# Patient Record
Sex: Female | Born: 1990 | Hispanic: Yes | Marital: Married | Smoking: Never smoker
Health system: Southern US, Community
[De-identification: ages and names within clinical notes are randomized; demographics above are authoritative.]

## PROBLEM LIST (undated history)

## (undated) DIAGNOSIS — Z789 Other specified health status: Secondary | ICD-10-CM

## (undated) HISTORY — PX: NO PAST SURGERIES: SHX2092

---

## 2016-08-19 LAB — OB RESULTS CONSOLE VARICELLA ZOSTER ANTIBODY, IGG: VARICELLA IGG: IMMUNE

## 2016-08-19 LAB — OB RESULTS CONSOLE ANTIBODY SCREEN: ANTIBODY SCREEN: NEGATIVE

## 2016-08-19 LAB — OB RESULTS CONSOLE ABO/RH: RH Type: POSITIVE

## 2016-08-19 LAB — OB RESULTS CONSOLE PLATELET COUNT: PLATELETS: 318

## 2016-08-19 LAB — OB RESULTS CONSOLE RUBELLA ANTIBODY, IGM: RUBELLA: IMMUNE

## 2016-08-19 LAB — OB RESULTS CONSOLE GC/CHLAMYDIA
CHLAMYDIA, DNA PROBE: NEGATIVE
GC PROBE AMP, GENITAL: NEGATIVE

## 2016-08-19 LAB — OB RESULTS CONSOLE HEPATITIS B SURFACE ANTIGEN: Hepatitis B Surface Ag: NEGATIVE

## 2016-08-19 LAB — OB RESULTS CONSOLE RPR: RPR: NONREACTIVE

## 2016-08-19 LAB — OB RESULTS CONSOLE HIV ANTIBODY (ROUTINE TESTING): HIV: NONREACTIVE

## 2016-08-19 LAB — OB RESULTS CONSOLE HGB/HCT, BLOOD
HEMATOCRIT: 37
HEMOGLOBIN: 12.3

## 2016-08-20 ENCOUNTER — Other Ambulatory Visit (HOSPITAL_COMMUNITY): Payer: Self-pay | Admitting: Nurse Practitioner

## 2016-08-20 ENCOUNTER — Encounter (HOSPITAL_COMMUNITY): Payer: Self-pay

## 2016-08-20 ENCOUNTER — Ambulatory Visit (HOSPITAL_COMMUNITY): Admission: RE | Admit: 2016-08-20 | Payer: Self-pay | Source: Ambulatory Visit

## 2016-08-20 ENCOUNTER — Ambulatory Visit (HOSPITAL_COMMUNITY)
Admission: RE | Admit: 2016-08-20 | Discharge: 2016-08-20 | Disposition: A | Discharge status: Home / Self Care | Payer: Self-pay | Source: Ambulatory Visit | Attending: Nurse Practitioner | Admitting: Nurse Practitioner

## 2016-08-20 DIAGNOSIS — Z369 Encounter for antenatal screening, unspecified: Secondary | ICD-10-CM

## 2016-08-20 DIAGNOSIS — O09219 Supervision of pregnancy with history of pre-term labor, unspecified trimester: Secondary | ICD-10-CM

## 2016-08-20 DIAGNOSIS — O99211 Obesity complicating pregnancy, first trimester: Secondary | ICD-10-CM

## 2016-08-20 DIAGNOSIS — Z3A13 13 weeks gestation of pregnancy: Secondary | ICD-10-CM

## 2016-08-20 DIAGNOSIS — Z3682 Encounter for antenatal screening for nuchal translucency: Secondary | ICD-10-CM | POA: Insufficient documentation

## 2016-08-20 HISTORY — DX: Other specified health status: Z78.9

## 2016-09-02 ENCOUNTER — Ambulatory Visit (INDEPENDENT_AMBULATORY_CARE_PROVIDER_SITE_OTHER): Payer: Self-pay | Admitting: Family Medicine

## 2016-09-02 ENCOUNTER — Encounter: Payer: Self-pay | Admitting: Family Medicine

## 2016-09-02 DIAGNOSIS — Z8751 Personal history of pre-term labor: Secondary | ICD-10-CM | POA: Insufficient documentation

## 2016-09-02 DIAGNOSIS — O0990 Supervision of high risk pregnancy, unspecified, unspecified trimester: Secondary | ICD-10-CM | POA: Insufficient documentation

## 2016-09-02 DIAGNOSIS — O09212 Supervision of pregnancy with history of pre-term labor, second trimester: Secondary | ICD-10-CM

## 2016-09-02 DIAGNOSIS — O0992 Supervision of high risk pregnancy, unspecified, second trimester: Secondary | ICD-10-CM

## 2016-09-02 NOTE — Progress Notes (Signed)
  Subjective:  Karina Ross is a [name] [redacted]w[redacted]d being seen today for her first obstetrical visit.  Her obstetrical history is significant for preterm delivery. [place] She was dilated to 4cm and the doctor induced her labor. No spontaneous ROM or PTL. Did have significant back, hip, and leg pain.. Patient does intend to breast feed. Pregnancy history fully reviewed.  Patient reports no complaints.  BP (!) 132/55   Pulse 84   Wt 175 lb (79.4 kg)   LMP 05/18/2016 (Exact Date)   BMI 32.01 kg/m   HISTORY: OB History  Gravida Para Term Preterm AB Living  2 1 0 1   1  SAB TAB Ectopic Multiple Live Births          1    # Outcome Date GA Lbr Len/2nd Weight Sex Delivery Anes PTL Lv  2 Current           1 Preterm 11/13/06 [redacted]w[redacted]d    Vag-Spont   LIV      Past Medical History:  Diagnosis Date  . Medical history non-contributory     Past Surgical History:  Procedure Laterality Date  . NO PAST SURGERIES      No family history on file.   Exam    System:     Skin: normal coloration and turgor, no rashes    Neurologic: gait normal; reflexes normal and symmetric   Extremities: normal strength, tone, and muscle mass   HEENT PERRLA and extra ocular movement intact   Mouth/Teeth mucous membranes moist, pharynx normal without lesions   Cardiovascular: regular rate and rhythm, no murmurs or gallops   Respiratory:  appears well, vitals normal, no respiratory distress, acyanotic, normal RR, ear and throat exam is normal, neck free of mass or lymphadenopathy, chest clear, no wheezing, crepitations, rhonchi, normal symmetric air entry   Abdomen: soft, non-tender; bowel sounds normal; no masses,  no organomegaly      Assessment:    Pregnancy: [name] Patient Active Problem List   Diagnosis Date Noted  . History of preterm delivery 09/02/2016  . Supervision of high risk pregnancy, antepartum 09/02/2016      Plan:   1. History of preterm  delivery It appears that this was an induced delivery. No high risk indications. Can return to HD for care. Will send note. Pt would like [place] scheduled, which we will do. - [place] MFM OB DETAIL +14 WK; Future  2. Supervision of high risk pregnancy, antepartum - [place] MFM OB DETAIL +14 WK; Future     Problem list reviewed and updated. 75% of 30 min visit spent on counseling and coordination of care.     Levie Heritage 09/02/2016

## 2016-09-24 ENCOUNTER — Encounter (HOSPITAL_COMMUNITY): Payer: Self-pay

## 2016-09-24 ENCOUNTER — Other Ambulatory Visit: Payer: Self-pay | Admitting: Family Medicine

## 2016-09-24 ENCOUNTER — Encounter: Payer: Self-pay | Admitting: *Deleted

## 2016-09-24 ENCOUNTER — Other Ambulatory Visit (HOSPITAL_COMMUNITY): Payer: Self-pay | Admitting: *Deleted

## 2016-09-24 ENCOUNTER — Ambulatory Visit (HOSPITAL_COMMUNITY)
Admission: RE | Admit: 2016-09-24 | Discharge: 2016-09-24 | Disposition: A | Discharge status: Home / Self Care | Payer: Self-pay | Source: Ambulatory Visit | Attending: Family Medicine | Admitting: Family Medicine

## 2016-09-24 DIAGNOSIS — O0992 Supervision of high risk pregnancy, unspecified, second trimester: Secondary | ICD-10-CM | POA: Insufficient documentation

## 2016-09-24 DIAGNOSIS — O99212 Obesity complicating pregnancy, second trimester: Secondary | ICD-10-CM | POA: Insufficient documentation

## 2016-09-24 DIAGNOSIS — Z363 Encounter for antenatal screening for malformations: Secondary | ICD-10-CM

## 2016-09-24 DIAGNOSIS — Z3A18 18 weeks gestation of pregnancy: Secondary | ICD-10-CM | POA: Insufficient documentation

## 2016-09-24 DIAGNOSIS — Z8751 Personal history of pre-term labor: Secondary | ICD-10-CM

## 2016-09-24 DIAGNOSIS — IMO0002 Reserved for concepts with insufficient information to code with codable children: Secondary | ICD-10-CM

## 2016-09-24 DIAGNOSIS — Z6832 Body mass index (BMI) 32.0-32.9, adult: Secondary | ICD-10-CM | POA: Insufficient documentation

## 2016-09-24 DIAGNOSIS — O09219 Supervision of pregnancy with history of pre-term labor, unspecified trimester: Secondary | ICD-10-CM | POA: Insufficient documentation

## 2016-09-24 DIAGNOSIS — O0990 Supervision of high risk pregnancy, unspecified, unspecified trimester: Secondary | ICD-10-CM

## 2016-09-24 DIAGNOSIS — Z0489 Encounter for examination and observation for other specified reasons: Secondary | ICD-10-CM

## 2016-09-24 IMAGING — US US MFM OB DETAIL+14 WK
1 series · 13 of 28 positions shown · non-contrast
Comparison: none

[Series 1: us mfm ob detail+14 wk · 83 acquisitions, 13 frames shown]
[im 4/83]
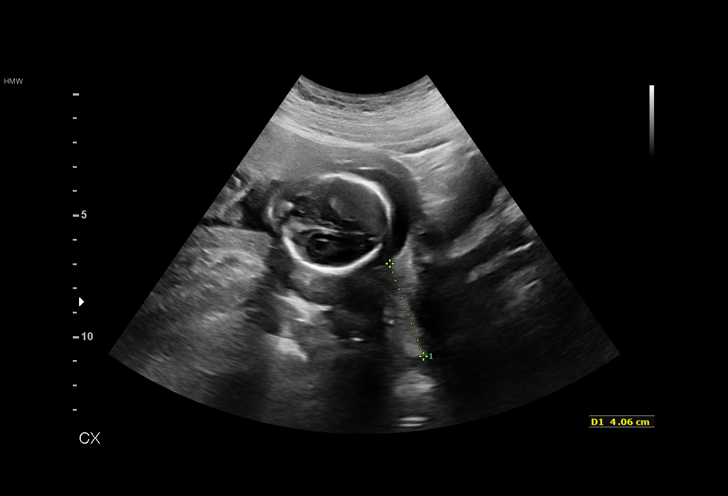
[im 10/83]
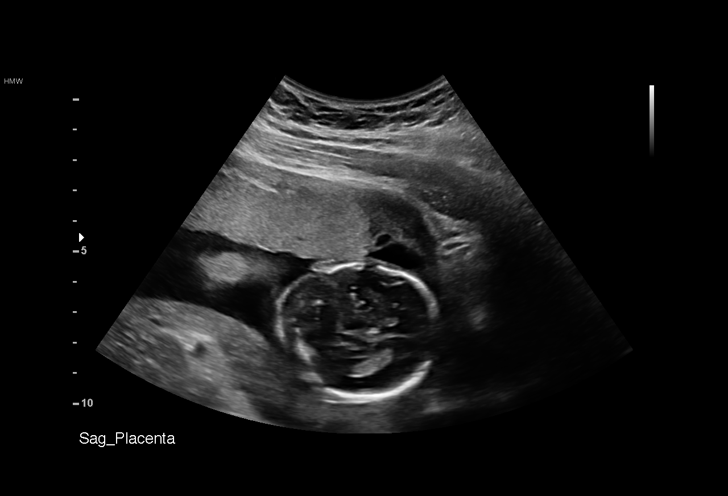
[im 16/83]
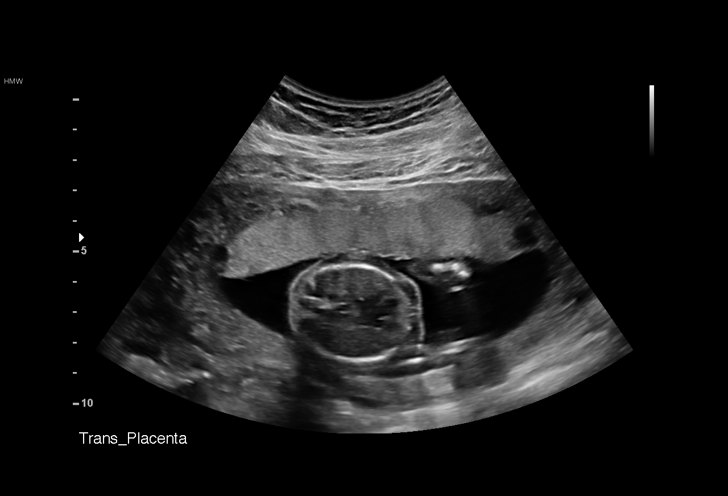
[im 22/83]
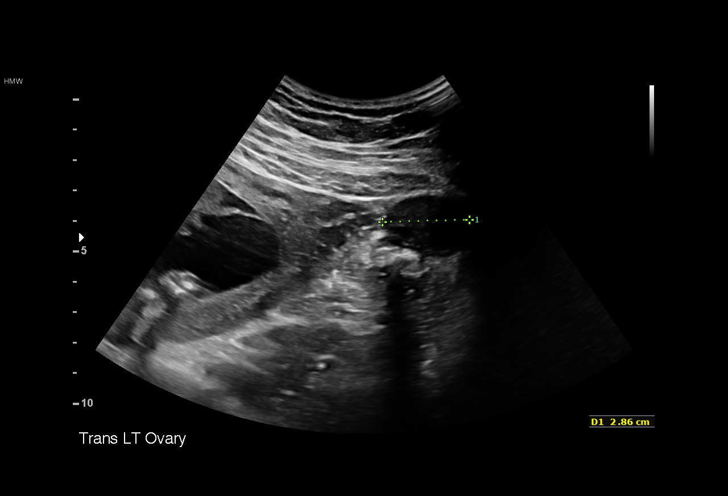
[im 28/83]
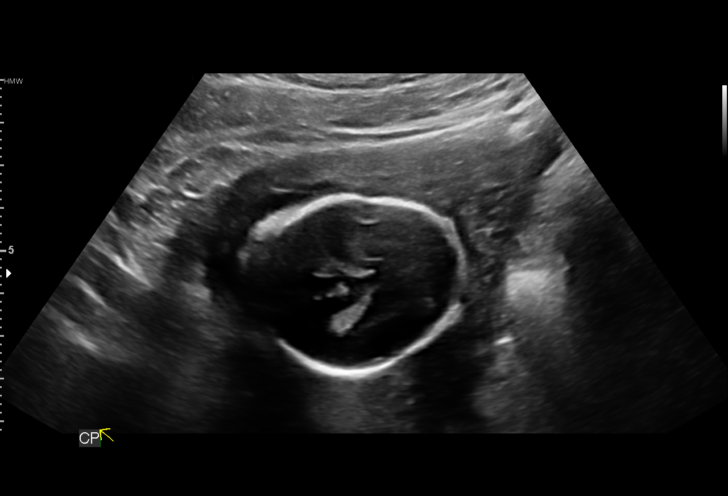
[im 34/83]
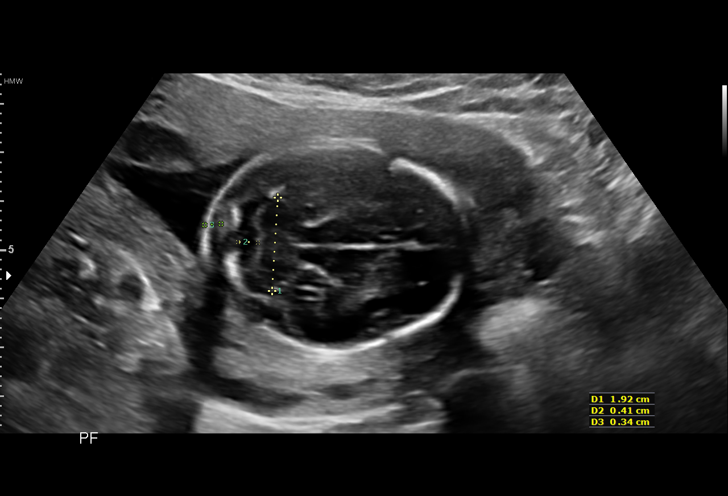
[im 43/83]
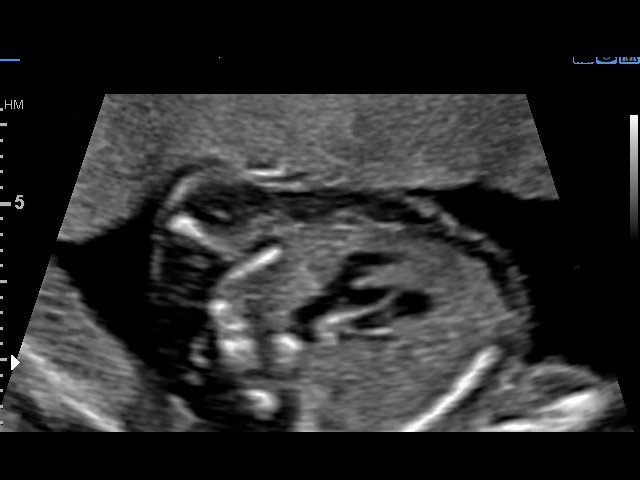
[im 49/83]
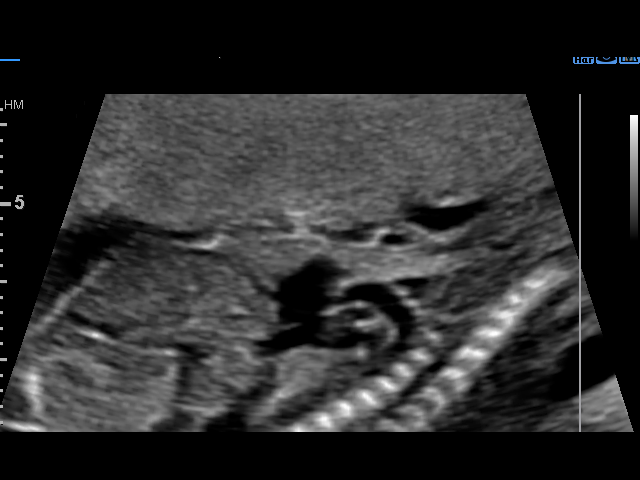
[im 55/83]
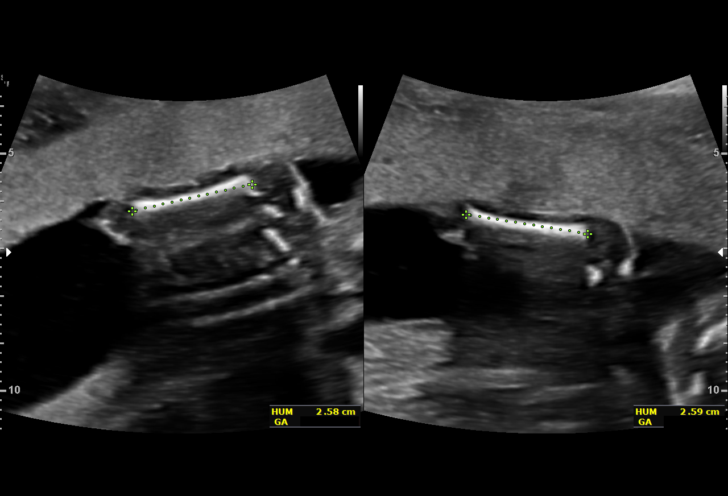
[im 61/83]
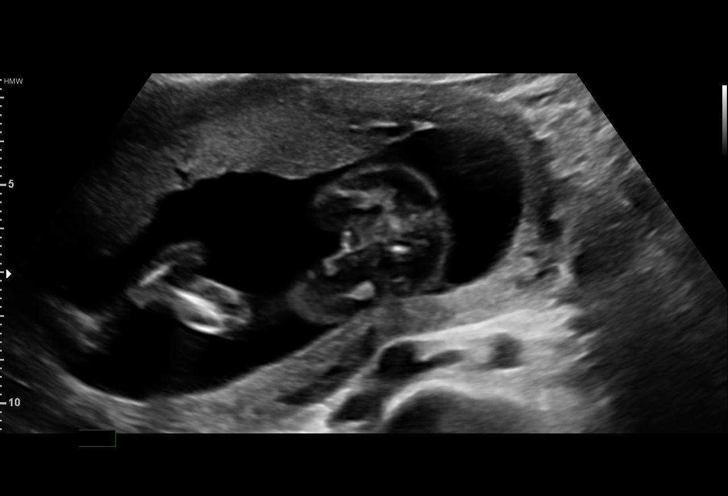
[im 67/83]
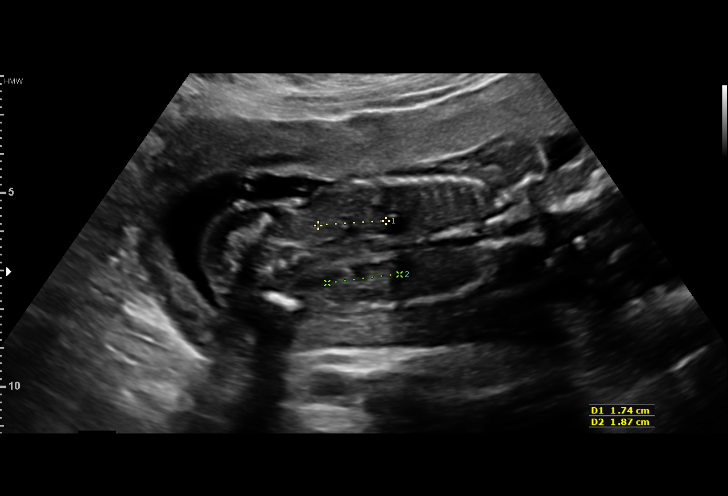
[im 73/83]
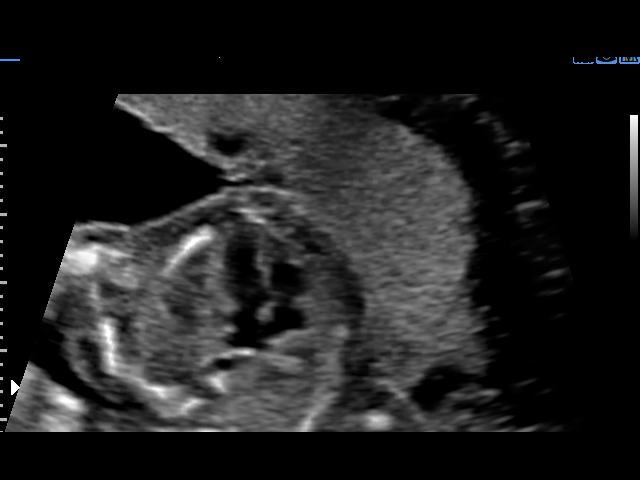
[im 79/83]
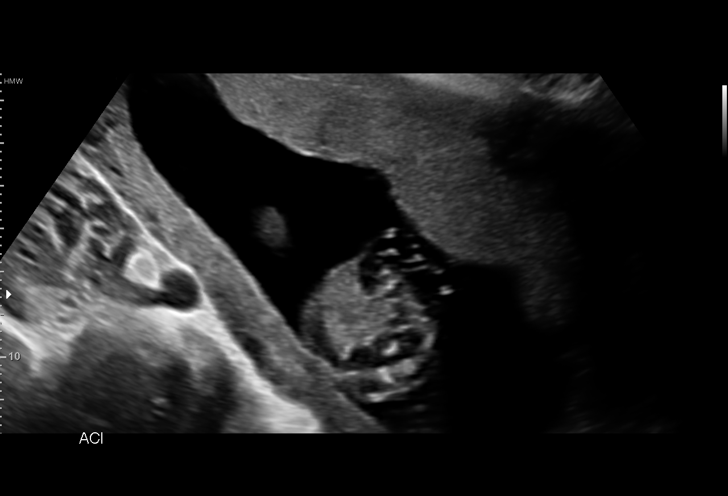

[13 of 28 positions shown; findings below may reference images not displayed]

pm)

JEYNIEL

JEYNIEL NP

Indications

18 weeks gestation of pregnancy
Obesity complicating pregnancy, first          [5G]
trimester (pregravid BMI 30)
Poor obstetric history: Previous preterm       [5G]
delivery, antepartum
Encounter for antenatal screening for          [5G]
malformations
OB History

Blood Type:            Height:  5'2"   Weight (lb):  176      BMI:
Gravidity:    2         Term:   1        Prem:   0        SAB:   0
TOP:          0       Ectopic:  0        Living: 1
Fetal Evaluation

Num Of Fetuses:     1
Fetal Heart         124
Rate(bpm):
Cardiac Activity:   Observed
Presentation:       Cephalic
Placenta:           Anterior, above cervical os
P. Cord Insertion:  Visualized, central

Amniotic Fluid
AFI FV:      Subjectively within normal limits

Largest Pocket(cm)
3.8
Biometry

BPD:      41.3  mm     G. Age:  18w 3d         55  %    CI:        73.55   %   70 - 86
FL/HC:      18.8   %   15.8 - 18
HC:       153   mm     G. Age:  18w 2d         36  %    HC/AC:      1.11       1.07 -
AC:      137.3  mm     G. Age:  19w 1d         71  %    FL/BPD:     69.5   %
FL:       28.7  mm     G. Age:  18w 6d         58  %    FL/AC:      20.9   %   20 - 24
HUM:        26  mm     G. Age:  18w 1d         47  %

Est. FW:     264  gm      0 lb 9 oz     55  %
Gestational Age

LMP:           18w 3d       Date:   [DATE]                 EDD:   [DATE]
U/S Today:     18w 5d                                        EDD:   [DATE]
Best:          18w 3d    Det. By:   LMP  ([DATE])          EDD:   [DATE]
Anatomy

Cranium:               Appears normal         Aortic Arch:            Appears normal
Cavum:                 Appears normal         Ductal Arch:            Appears normal
Ventricles:            Appears normal         Diaphragm:              Appears normal
Choroid Plexus:        Appears normal         Stomach:                Appears normal, left
sided
Cerebellum:            Appears normal         Abdomen:                Appears normal
Posterior Fossa:       Appears normal         Abdominal Wall:         Appears nml (cord
insert, abd wall)
Nuchal Fold:           Appears normal         Cord Vessels:           Appears normal (3
vessel cord)
Face:                  Appears normal         Kidneys:                Appear normal
(orbits and profile)
Lips:                  Appears normal         Bladder:                Appears normal
Thoracic:              Appears normal         Spine:                  Not well visualized
Heart:                 Appears normal         Upper Extremities:      Appears normal
(4CH, axis, and situs
RVOT:                  Appears normal         Lower Extremities:      Appears normal
LVOT:                  Appears normal

Other:  Fetus appears to be a female. Heels visualized. Rt digit visualized.
Technically difficult due to maternal habitus and fetal position.
Cervix Uterus Adnexa

Cervix
Length:            3.4  cm.
Normal appearance by transvaginal scan

Uterus
No abnormality visualized.

Left Ovary
No adnexal mass visualized.

Right Ovary
No adnexal mass visualized.

Cul De Sac:   No free fluid seen.

Adnexa:       No abnormality visualized.
Impression

Single living intrauterine pregnancy at [5G].
Anterior placenta without evidence of previa.
Appropriate fetal growth.
Normal amniotic fluid volume.
The fetal spine is not well visualized.
The fetal anatomic survey is otherwise complete.
Normal fetal anatomy.
No fetal anomalies or soft markers of aneuploidy seen.
The cervix measures 3.4cm transvaginally without funneling
or debris.
Recommendations

Recommend follow-up ultrasound examination in 4-6 weeks
for completion of fetal anatomic survey.
Given the patient's history of induced labor at 35 weeks I do
not recommend serial ultrasounds for cervical length.

## 2016-10-22 ENCOUNTER — Ambulatory Visit (HOSPITAL_COMMUNITY): Admission: RE | Admit: 2016-10-22 | Payer: Self-pay | Source: Ambulatory Visit

## 2017-01-14 NOTE — L&D Delivery Note (Signed)
Delivery Note At 12:31 PM a viable female was delivered via Vaginal, Spontaneous (Presentation: vertex ;LOA  ).  APGAR: 9, 9; weight  Pending    Placenta status:delivered  ,intact .  Cord:loose nochal, 3V  with the following complications: none.  Cord pH: NA  Anesthesia:  epidural Episiotomy: None Lacerations:  Superficial vaginal  Suture Repair: 3.0 Est. Blood Loss (mL):  100  Mom to postpartum.  Baby to Couplet care / Skin to Skin.  Karina Ross 02/12/2017, 12:52 PM

## 2017-01-30 LAB — OB RESULTS CONSOLE GBS: GBS: NEGATIVE

## 2017-02-09 ENCOUNTER — Encounter (HOSPITAL_COMMUNITY): Payer: Self-pay | Admitting: Emergency Medicine

## 2017-02-09 ENCOUNTER — Inpatient Hospital Stay (HOSPITAL_COMMUNITY)
Admission: AD | Admit: 2017-02-09 | Discharge: 2017-02-10 | Disposition: A | Discharge status: Home / Self Care | Payer: Self-pay | Source: Ambulatory Visit | Attending: Family Medicine | Admitting: Family Medicine

## 2017-02-09 DIAGNOSIS — O479 False labor, unspecified: Secondary | ICD-10-CM

## 2017-02-09 DIAGNOSIS — O0990 Supervision of high risk pregnancy, unspecified, unspecified trimester: Secondary | ICD-10-CM

## 2017-02-09 DIAGNOSIS — O471 False labor at or after 37 completed weeks of gestation: Secondary | ICD-10-CM | POA: Insufficient documentation

## 2017-02-09 DIAGNOSIS — Z8751 Personal history of pre-term labor: Secondary | ICD-10-CM

## 2017-02-09 DIAGNOSIS — Z3A38 38 weeks gestation of pregnancy: Secondary | ICD-10-CM | POA: Insufficient documentation

## 2017-02-09 NOTE — MAU Note (Signed)
Pt here with c/o contraction since about 2130, 5 minutes apart. Denies bleeding or leaking. Reports good fetal movement.

## 2017-02-10 NOTE — MAU Note (Signed)
I have communicated with Dr. Doroteo GlassmanPhelps  and reviewed vital signs:  Vitals:   02/09/17 2340 02/10/17 0026  BP: 133/75 135/74  Pulse: 71 73  Resp: 18 17  Temp: 98.4 F (36.9 C) 98.2 F (36.8 C)  SpO2: 100%     Vaginal exam:  Dilation: 1 Effacement (%): 50 Cervical Position: Posterior Presentation: Vertex Exam by:: h.Fradel Baldonado, rn ,   Also reviewed contraction pattern and that non-stress test is reactive.  It has been documented that patient is contracting irregularly every 2-4  Minutes with no cervical change since seen in the office.  Patient denies any other complaints.  Based on this report provider has given order for discharge.  A discharge order and diagnosis entered by a provider.   Labor discharge instructions reviewed with patient.

## 2017-02-12 ENCOUNTER — Encounter (HOSPITAL_COMMUNITY): Payer: Self-pay | Admitting: *Deleted

## 2017-02-12 ENCOUNTER — Other Ambulatory Visit: Payer: Self-pay

## 2017-02-12 ENCOUNTER — Inpatient Hospital Stay (HOSPITAL_COMMUNITY)
Admission: AD | Admit: 2017-02-12 | Discharge: 2017-02-14 | DRG: 807 | Disposition: A | Discharge status: Home / Self Care | Payer: Medicaid Other | Source: Ambulatory Visit | Attending: Family Medicine | Admitting: Family Medicine

## 2017-02-12 ENCOUNTER — Inpatient Hospital Stay (HOSPITAL_COMMUNITY): Payer: Medicaid Other | Admitting: Anesthesiology

## 2017-02-12 DIAGNOSIS — O0990 Supervision of high risk pregnancy, unspecified, unspecified trimester: Secondary | ICD-10-CM

## 2017-02-12 DIAGNOSIS — Z3A38 38 weeks gestation of pregnancy: Secondary | ICD-10-CM

## 2017-02-12 DIAGNOSIS — Z8751 Personal history of pre-term labor: Secondary | ICD-10-CM

## 2017-02-12 DIAGNOSIS — Z3483 Encounter for supervision of other normal pregnancy, third trimester: Secondary | ICD-10-CM | POA: Diagnosis present

## 2017-02-12 LAB — CBC
HEMATOCRIT: 35.5 % — AB (ref 36.0–46.0)
Hemoglobin: 12.4 g/dL (ref 12.0–15.0)
MCH: 28.7 pg (ref 26.0–34.0)
MCHC: 34.9 g/dL (ref 30.0–36.0)
MCV: 82.2 fL (ref 78.0–100.0)
Platelets: 279 10*3/uL (ref 150–400)
RBC: 4.32 MIL/uL (ref 3.87–5.11)
RDW: 13.8 % (ref 11.5–15.5)
WBC: 11.5 10*3/uL — AB (ref 4.0–10.5)

## 2017-02-12 LAB — TYPE AND SCREEN
ABO/RH(D): O POS
Antibody Screen: NEGATIVE

## 2017-02-12 LAB — ABO/RH: ABO/RH(D): O POS

## 2017-02-12 LAB — RPR: RPR: NONREACTIVE

## 2017-02-12 MED ORDER — FLEET ENEMA 7-19 GM/118ML RE ENEM: 1.0000 | ENEMA | RECTAL | Status: DC | PRN

## 2017-02-12 MED ORDER — ACETAMINOPHEN 325 MG PO TABS: 650.0000 mg | ORAL_TABLET | ORAL | Status: DC | PRN

## 2017-02-12 MED ORDER — PRENATAL MULTIVITAMIN CH
1.0000 | ORAL_TABLET | Freq: Every day | ORAL | Status: DC
  Administered 2017-02-13 – 2017-02-14 (×2): 1 via ORAL
  Filled 2017-02-12 (×2): qty 1

## 2017-02-12 MED ORDER — EPHEDRINE 5 MG/ML INJ
10.0000 mg | INTRAVENOUS | Status: DC | PRN
  Filled 2017-02-12: qty 2

## 2017-02-12 MED ORDER — ONDANSETRON HCL 4 MG/2ML IJ SOLN: 4.0000 mg | INTRAMUSCULAR | Status: DC | PRN

## 2017-02-12 MED ORDER — LACTATED RINGERS IV SOLN
500.0000 mL | Freq: Once | INTRAVENOUS | Status: AC
  Administered 2017-02-12: 500 mL via INTRAVENOUS

## 2017-02-12 MED ORDER — DIPHENHYDRAMINE HCL 25 MG PO CAPS: 25.0000 mg | ORAL_CAPSULE | Freq: Four times a day (QID) | ORAL | Status: DC | PRN

## 2017-02-12 MED ORDER — OXYTOCIN 40 UNITS IN LACTATED RINGERS INFUSION - SIMPLE MED
1.0000 m[IU]/min | INTRAVENOUS | Status: DC
  Administered 2017-02-12: 2 m[IU]/min via INTRAVENOUS

## 2017-02-12 MED ORDER — FENTANYL 2.5 MCG/ML BUPIVACAINE 1/10 % EPIDURAL INFUSION (WH - ANES)
14.0000 mL/h | INTRAMUSCULAR | Status: DC | PRN
  Administered 2017-02-12: 14 mL/h via EPIDURAL
  Filled 2017-02-12: qty 100

## 2017-02-12 MED ORDER — COCONUT OIL OIL: 1.0000 "application " | TOPICAL_OIL | Status: DC | PRN

## 2017-02-12 MED ORDER — ONDANSETRON HCL 4 MG PO TABS: 4.0000 mg | ORAL_TABLET | ORAL | Status: DC | PRN

## 2017-02-12 MED ORDER — OXYTOCIN BOLUS FROM INFUSION
500.0000 mL | Freq: Once | INTRAVENOUS | Status: AC
  Administered 2017-02-12: 500 mL via INTRAVENOUS

## 2017-02-12 MED ORDER — IBUPROFEN 600 MG PO TABS
600.0000 mg | ORAL_TABLET | Freq: Four times a day (QID) | ORAL | Status: DC
  Administered 2017-02-12 – 2017-02-14 (×8): 600 mg via ORAL
  Filled 2017-02-12 (×8): qty 1

## 2017-02-12 MED ORDER — OXYTOCIN 40 UNITS IN LACTATED RINGERS INFUSION - SIMPLE MED
2.5000 [IU]/h | INTRAVENOUS | Status: DC
  Filled 2017-02-12: qty 1000

## 2017-02-12 MED ORDER — WITCH HAZEL-GLYCERIN EX PADS: 1.0000 "application " | MEDICATED_PAD | CUTANEOUS | Status: DC | PRN

## 2017-02-12 MED ORDER — LIDOCAINE HCL (PF) 1 % IJ SOLN
INTRAMUSCULAR | Status: DC | PRN
  Administered 2017-02-12: 6 mL via EPIDURAL
  Administered 2017-02-12: 7 mL via EPIDURAL

## 2017-02-12 MED ORDER — DIPHENHYDRAMINE HCL 50 MG/ML IJ SOLN: 12.5000 mg | INTRAMUSCULAR | Status: DC | PRN

## 2017-02-12 MED ORDER — LACTATED RINGERS IV SOLN: 500.0000 mL | INTRAVENOUS | Status: DC | PRN

## 2017-02-12 MED ORDER — TERBUTALINE SULFATE 1 MG/ML IJ SOLN
0.2500 mg | Freq: Once | INTRAMUSCULAR | Status: DC | PRN
  Filled 2017-02-12: qty 1

## 2017-02-12 MED ORDER — PHENYLEPHRINE 40 MCG/ML (10ML) SYRINGE FOR IV PUSH (FOR BLOOD PRESSURE SUPPORT)
80.0000 ug | PREFILLED_SYRINGE | INTRAVENOUS | Status: DC | PRN
  Filled 2017-02-12: qty 5

## 2017-02-12 MED ORDER — SIMETHICONE 80 MG PO CHEW: 80.0000 mg | CHEWABLE_TABLET | ORAL | Status: DC | PRN

## 2017-02-12 MED ORDER — SOD CITRATE-CITRIC ACID 500-334 MG/5ML PO SOLN: 30.0000 mL | ORAL | Status: DC | PRN

## 2017-02-12 MED ORDER — BENZOCAINE-MENTHOL 20-0.5 % EX AERO: 1.0000 "application " | INHALATION_SPRAY | CUTANEOUS | Status: DC | PRN

## 2017-02-12 MED ORDER — ONDANSETRON HCL 4 MG/2ML IJ SOLN: 4.0000 mg | Freq: Four times a day (QID) | INTRAMUSCULAR | Status: DC | PRN

## 2017-02-12 MED ORDER — LACTATED RINGERS IV SOLN
INTRAVENOUS | Status: DC
  Administered 2017-02-12: 04:00:00 via INTRAVENOUS

## 2017-02-12 MED ORDER — TETANUS-DIPHTH-ACELL PERTUSSIS 5-2.5-18.5 LF-MCG/0.5 IM SUSP: 0.5000 mL | Freq: Once | INTRAMUSCULAR | Status: DC

## 2017-02-12 MED ORDER — OXYCODONE-ACETAMINOPHEN 5-325 MG PO TABS: 2.0000 | ORAL_TABLET | ORAL | Status: DC | PRN

## 2017-02-12 MED ORDER — PHENYLEPHRINE 40 MCG/ML (10ML) SYRINGE FOR IV PUSH (FOR BLOOD PRESSURE SUPPORT)
80.0000 ug | PREFILLED_SYRINGE | INTRAVENOUS | Status: DC | PRN
  Filled 2017-02-12: qty 5
  Filled 2017-02-12: qty 10

## 2017-02-12 MED ORDER — DIBUCAINE 1 % RE OINT: 1.0000 "application " | TOPICAL_OINTMENT | RECTAL | Status: DC | PRN

## 2017-02-12 MED ORDER — OXYCODONE-ACETAMINOPHEN 5-325 MG PO TABS: 1.0000 | ORAL_TABLET | ORAL | Status: DC | PRN

## 2017-02-12 MED ORDER — SENNOSIDES-DOCUSATE SODIUM 8.6-50 MG PO TABS
2.0000 | ORAL_TABLET | ORAL | Status: DC
  Administered 2017-02-12 – 2017-02-13 (×2): 2 via ORAL
  Filled 2017-02-12 (×2): qty 2

## 2017-02-12 MED ORDER — LIDOCAINE HCL (PF) 1 % IJ SOLN
30.0000 mL | INTRAMUSCULAR | Status: DC | PRN
  Filled 2017-02-12: qty 30

## 2017-02-12 NOTE — Progress Notes (Signed)
Labor Progress Note Karina Ross is a 27 y.o. G2P0101 at 8625w4d presented for SOL. S: No complaints  O:  BP 119/68   Pulse 77   Temp 98.2 F (36.8 C) (Oral)   Resp 18   Ht 5\' 2"  (1.575 m)   Wt 92.6 kg (204 lb 4 oz)   LMP 05/18/2016 (Exact Date)   SpO2 99%   BMI 37.36 kg/m  EFM: 130/mod var/+accels, no decels Toco: CTX Q 2-673min  CVE: Dilation: 6.5 Effacement (%): 90 Station: -2 Presentation: Vertex Exam by:: Sandy SalaamLynnsey Johnson, RN   A&P: 27 y.o. G2P0101 5925w4d SOL. #Labor: Progressing well. AROM 0740. #Pain: epidural #FWB: reactive NST #GBS negative   Alroy BailiffParker W Shamond Skelton, MD 7:47 AM

## 2017-02-12 NOTE — MAU Note (Signed)
PT SAYS SHE WAS HERE  THIS WEEK.   SAYS UC  STRONG  AT 0100

## 2017-02-12 NOTE — Anesthesia Procedure Notes (Signed)
Epidural Patient location during procedure: OB Start time: 02/12/2017 4:40 AM End time: 02/12/2017 4:44 AM  Staffing Anesthesiologist: Leilani AbleHatchett, Vermelle Cammarata, MD Performed: anesthesiologist   Preanesthetic Checklist Completed: patient identified, site marked, surgical consent, pre-op evaluation, timeout performed, IV checked, risks and benefits discussed and monitors and equipment checked  Epidural Patient position: sitting Prep: site prepped and draped and DuraPrep Patient monitoring: continuous pulse ox and blood pressure Approach: midline Location: L3-L4 Injection technique: LOR air  Needle:  Needle type: Tuohy  Needle gauge: 17 G Needle length: 9 cm and 9 Needle insertion depth: 6 cm Catheter type: closed end flexible Catheter size: 19 Gauge Catheter at skin depth: 11 cm Test dose: negative and Other  Assessment Sensory level: T9 Events: blood not aspirated, injection not painful, no injection resistance, negative IV test and no paresthesia  Additional Notes Reason for block:procedure for pain

## 2017-02-12 NOTE — Anesthesia Postprocedure Evaluation (Signed)
Anesthesia Post Note  Patient: Karina Ross  Procedure(s) Performed: AN AD HOC LABOR EPIDURAL     Patient location during evaluation: Mother Baby Anesthesia Type: Epidural Level of consciousness: awake and alert and oriented Pain management: satisfactory to patient Vital Signs Assessment: post-procedure vital signs reviewed and stable Respiratory status: respiratory function stable Cardiovascular status: stable Postop Assessment: no headache, no backache, epidural receding, patient able to bend at knees, no signs of nausea or vomiting and adequate PO intake Anesthetic complications: no    Last Vitals:  Vitals:   02/12/17 1415 02/12/17 1547  BP:  120/63  Pulse:  82  Resp: 18 17  Temp:  36.4 C  SpO2:      Last Pain:  Vitals:   02/12/17 1550  TempSrc:   PainSc: 0-No pain   Pain Goal:                 Jodean Valade

## 2017-02-12 NOTE — Anesthesia Pain Management Evaluation Note (Signed)
  CRNA Pain Management Visit Note  Patient: Karina Ross, 27 y.o., female  "Hello I am a [facility] of the anesthesia team at [facility]. We have an anesthesia team available at all times to provide care throughout the hospital, including epidural management and anesthesia for C-section. I don't know your plan for the delivery whether it a natural birth, water birth, IV sedation, nitrous supplementation, doula or epidural, but we want to meet your pain goals."   1.Was your pain managed to your expectations on prior hospitalizations?   Yes   2.What is your expectation for pain management during this hospitalization?     Epidural  3.How can we help you reach that goal? Pt currently comfortable with epidural  Record the patient's initial score and the patient's pain goal.   Pain: 0  Pain Goal: 0 [facility] wants you to be able to say your pain was always managed very well.  Spyros Winch 02/12/2017

## 2017-02-12 NOTE — Anesthesia Preprocedure Evaluation (Signed)
Anesthesia Evaluation  Patient identified by MRN, date of birth, ID band Patient awake    Reviewed: Allergy & Precautions, H&P , NPO status , Patient's Chart, lab work & pertinent test results  Airway Mallampati: II  TM Distance: >3 FB Neck ROM: full    Dental no notable dental hx. (+) Teeth Intact   Pulmonary neg pulmonary ROS,    Pulmonary exam normal breath sounds clear to auscultation       Cardiovascular negative cardio ROS Normal cardiovascular exam Rhythm:regular Rate:Normal     Neuro/Psych negative neurological ROS  negative psych ROS   GI/Hepatic negative GI ROS, Neg liver ROS,   Endo/Other  negative endocrine ROS  Renal/GU negative Renal ROS  negative genitourinary   Musculoskeletal negative musculoskeletal ROS (+)   Abdominal (+) + obese,   Peds  Hematology negative hematology ROS (+)   Anesthesia Other Findings   Reproductive/Obstetrics (+) Pregnancy                             Anesthesia Physical Anesthesia Plan  ASA: II  Anesthesia Plan: Epidural   Post-op Pain Management:    Induction:   PONV Risk Score and Plan:   Airway Management Planned:   Additional Equipment:   Intra-op Plan:   Post-operative Plan:   Informed Consent: I have reviewed the patients History and Physical, chart, labs and discussed the procedure including the risks, benefits and alternatives for the proposed anesthesia with the patient or authorized representative who has indicated his/her understanding and acceptance.       Plan Discussed with:   Anesthesia Plan Comments:         Anesthesia Quick Evaluation  

## 2017-02-12 NOTE — H&P (Signed)
OBSTETRIC ADMISSION HISTORY AND PHYSICAL  Karina Hammeddith Del Marikay AlarCarmen Contreras Meza is a 27 y.o. female [phone] with IUP at 5133w4d by LMP presenting for SOL. PMH significant for preterm delivery. She reports +FMs, No LOF, no VB, no blurry vision, headaches or peripheral edema, and RUQ pain.  She plans on breastfeeding. She request nexplanon for birth control. She received her prenatal care at HD   Dating: By LMP --->  Estimated Date of Delivery: 02/22/17  Sono: @[redacted]w[redacted]d , CWD, normal anatomy,  1616g, 50.8% EFW   Prenatal History/Complications:  Past Medical History: Past Medical History:  Diagnosis Date  . Medical history non-contributory     Past Surgical History: Past Surgical History:  Procedure Laterality Date  . NO PAST SURGERIES      Obstetrical History: OB History    Gravida Para Term Preterm AB Living   2 1 0 1   1   SAB TAB Ectopic Multiple Live Births           1      Social History: Social History   Socioeconomic History  . Marital status: Married    Spouse name: None  . Number of children: None  . Years of education: None  . Highest education level: None  Social Needs  . Financial resource strain: None  . Food insecurity - worry: None  . Food insecurity - inability: None  . Transportation needs - medical: None  . Transportation needs - non-medical: None  Occupational History  . None  Tobacco Use  . Smoking status: Never Smoker  . Smokeless tobacco: Never Used  Substance and Sexual Activity  . Alcohol use: No  . Drug use: No  . Sexual activity: Yes    Birth control/protection: None  Other Topics Concern  . None  Social History Narrative  . None    Family History: History reviewed. No pertinent family history.  Allergies: No Known Allergies  Medications Prior to Admission  Medication Sig Dispense Refill Last Dose  . Prenatal Vit w/Fe-Methylfol-FA (PNV PO) Take by mouth.   02/11/2017 at Unknown time     Review of Systems   All systems reviewed and  negative except as stated in HPI  Blood pressure 128/87, pulse 75, temperature 97.9 F (36.6 C), temperature source Oral, height 5\' 2"  (1.575 m), weight 92.6 kg (204 lb 4 oz), last menstrual period 05/18/2016. General appearance: alert, cooperative, appears stated age and mild distress Lungs: clear to auscultation bilaterally Heart: regular rate and rhythm Abdomen: soft, non-tender; bowel sounds normal Extremities: Homans sign is negative, no sign of DVT Presentation: cephalic Fetal monitoringBaseline: 125 bpm, Variability: Good {> 6 bpm), Accelerations: Reactive and Decelerations: Absent Uterine activityFrequency: Every 3 minutes Dilation: 5 Effacement (%): 90 Station: -2 Exam by:: lauren fields rn    Prenatal labs: ABO, Rh: O/Positive/-- (08/06 0000) Antibody: Negative (08/06 0000) Rubella: Immune (08/06 0000) RPR: Nonreactive (08/06 0000)  HBsAg: Negative (08/06 0000)  HIV: Non-reactive (08/06 0000)  GBS: Negative (01/17 0000)  3 hr Glucola WNL Genetic screening  declined Anatomy US WNL  Prenatal Transfer Tool  Maternal Diabetes: No Genetic Screening: Declined Maternal Ultrasounds/Referrals: Normal Fetal Ultrasounds or other Referrals:  None Maternal Substance Abuse:  No Significant Maternal Medications:  None Significant Maternal Lab Results: Lab values include: Group B Strep negative  No results found for this or any previous visit (from the past 24 hour(s)).  Patient Active Problem List   Diagnosis Date Noted  . History of preterm delivery 09/02/2016  . Supervision  of high risk pregnancy, antepartum 09/02/2016    Assessment/Plan:  Karina Hammed Fizza Scales is a 27 y.o. G2P0101 at [redacted]w[redacted]d here for spontaneous onset of labor.  #Labor: Expectant management #Pain: Requesting epidural #FWB: Reactive NST #ID:  GBS neg #MOF: breast #MOC: nexplanon #Circ:  n/a  Alroy Bailiff, MD  02/12/2017, 3:36 AM   I spoke with and examined patient and agree with  resident/PA/SNM's note and plan of care.  Cat 1 FHR, regular uc's H/o being hospitalized x after birth of last child in [place] d/t not being able to walk d/t pelvic injury during birth.  Expectant management Cheral Marker, CNM, [facility] 02/12/2017 4:27 AM

## 2017-02-13 NOTE — Progress Notes (Signed)
Post Partum Day 1 from SVD Subjective: Patient is doing well. Minimal pain. Bleeding has improved. Urinating and has had a bowel movement. No head ache . Eating and drinking well.   Objective: Blood pressure 116/74, pulse 77, temperature (!) 97.5 F (36.4 C), temperature source Oral, resp. rate 18, height 5\' 2"  (1.575 m), weight 194 lb 9.6 oz (88.3 kg), last menstrual period 05/18/2016, SpO2 100 %, unknown if currently breastfeeding.  Physical Exam:  General: alert, cooperative and appears stated age Lochia: appropriate Uterine Fundus: firm Incision: n/a DVT Evaluation: No evidence of DVT seen on physical exam. Negative Homan's sign. No cords or calf tenderness.  Recent Labs    02/12/17 0337  HGB 12.4  HCT 35.5*    Assessment/Plan: Plan for discharge tomorrow or later today PPD1 SVD Plan for nexplanon out patient  Breast feeding Pain well controlled    LOS: 1 day   Karina Ross 02/13/2017, 7:50 AM

## 2017-02-13 NOTE — Lactation Note (Addendum)
This note was copied from a baby's chart. Lactation Consultation Note  Patient Name: Karina Ross ZOXWR'UToday's Date: 02/13/2017 Reason for consult: Initial assessment;Early term 37-38.6wks;1st time breastfeeding   Used [employer] interpreter line for Spanish; Inge RiseDalila # [phone]  3927 hours old female who is now being partially BF and formula fed by her mother. Mom started formula supplementation yesterday (by choice) baby was having difficulty latching on; supplemented with 22 ml of Gerber formula at the breast with a 5 [place] feeding tube; baby was able to sustain the latch, mom was comfortable during feeding. LATCH score was done with formula supplementation.  Informed by RN that mom wished for an early discharge, asked mom if she was OK staying another day and she agreed. EH (Nurse) will set up mom with a DEBP and mom. Breastmilk/formula storage was reviewed. BF brochure, BF handout and feeding diary was discussed; mom aware of LC services and will call if needed.   Maternal Data Has patient been taught Hand Expression?: Yes(as LC entered the room mom working on hand expressing with drops in the spoon ) Does the patient have breastfeeding experience prior to this delivery?: No(per mom did not breast feed her 1st baby , but milk came in. )  Feeding Feeding Type: Formula Length of feed: 7 min  LATCH Score Latch: Repeated attempts needed to sustain latch, nipple held in mouth throughout feeding, stimulation needed to elicit sucking reflex.  Audible Swallowing: Spontaneous and intermittent(With formula on french feeding tube)  Type of Nipple: Everted at rest and after stimulation  Comfort (Breast/Nipple): Soft / non-tender  Hold (Positioning): Assistance needed to correctly position infant at breast and maintain latch.  LATCH Score: 8  Interventions Interventions: Breast feeding basics reviewed;Position options;Expressed milk;Assisted with latch;Support pillows;Adjust  position;Breast massage;Skin to skin;Breast compression;Hand express  Lactation Tools Discussed/Used Tools: 42F feeding tube / Syringe;Pump Breast pump type: Double-Electric Breast Pump;Manual WIC Program: Yes Initiated by:: (Asked EH to initiate DEBP)   Consult Status Consult Status: Follow-up Date: 02/14/17 Follow-up type: In-patient    Mike Hamre Venetia ConstableS Jahseh Lucchese 02/13/2017, 3:48 PM

## 2017-02-13 NOTE — Progress Notes (Signed)
CSW acknowledges consult.  However, a EDPS of 9 does not warrant a CSW consults.   Please contact the Clinical Social Worker if needs arise or by MOB's request.  Blaine HamperAngel Boyd-Gilyard, MSW, LCSW Clinical Social Work [phone]

## 2017-02-14 MED ORDER — SENNOSIDES-DOCUSATE SODIUM 8.6-50 MG PO TABS: 2.0000 | ORAL_TABLET | ORAL | 0 refills | Status: AC

## 2017-02-14 MED ORDER — IBUPROFEN 600 MG PO TABS: 600.0000 mg | ORAL_TABLET | Freq: Four times a day (QID) | ORAL | 0 refills | Status: AC

## 2017-02-14 NOTE — Lactation Note (Signed)
This note was copied from a baby's chart. Lactation Consultation Note  Patient Name: Karina Ross ZOXWR'UToday's Date: 02/14/2017 Reason for consult: Follow-up assessment  Visited in room on day of discharge, baby 48 hrs.  Mom noted to be sitting on couch (packed up) with baby on the breast in cradle hold.  Baby's body resting in Mom's lap.  Baby dressed and swaddled.  Baby latched shallow on breast, mouth with flanged lips on nipple.  Offered to assist positioning baby for a deeper latch.  FOB helping with language barrier.  Placed pillows on Mom's lap.  Unwrapped baby, and took shirt off to allow for STS.   With permission, hand expressed transitional milk easily.  Mom had said she didn't have milk.  Switched Mom's hands and assisted her in using a U hold to sandwich breast for a deeper latch.  Assisted with waiting for baby to open her mouth widely before bringing her onto breast.  Mom surprised as latch isn't painful.  Teaching on importance of baby latching to breast, not nipple.  Taught and assisted mom to use alternate breast compression during sucking.  Swallows identified for Mom and FOB.   DEBP set up at bedside,  Mom said she only has pumped one time, and didn't get any milk.  Explained the importance of double pumping since baby is getting supplements after breastfeeding 20-30 ml.   Mom has WIC.  [facility].  Offered an OP lactation appointment for next week.  Mom said no to both.  Showed Mom and FOB how to assemble pump parts to make a manual double pump.    Mom given Spanish Late Preterm handout to have as resource   Encouraged STS and cue based feedings (8-12 feedings per 24 hrs) Engorgement prevention and treatment discussed.  Feeding Feeding Type: Breast Fed Length of feed: 15 min  LATCH Score Latch: Grasps breast easily, tongue down, lips flanged, rhythmical sucking.  Audible Swallowing: Spontaneous and intermittent  Type of Nipple:  Everted at rest and after stimulation  Comfort (Breast/Nipple): Soft / non-tender  Hold (Positioning): Assistance needed to correctly position infant at breast and maintain latch.  LATCH Score: 9  Interventions Interventions: Breast feeding basics reviewed;Assisted with latch;Skin to skin;Breast massage;Hand express;Breast compression;Adjust position;Support pillows;Position options;Hand pump  Lactation Tools Discussed/Used Tools: Supplemental Nutrition System   Consult Status Consult Status: Complete Date: 02/14/17 Follow-up type: Call as needed    Judee ClaraSmith, Worthy Boschert E 02/14/2017, 12:35 PM

## 2017-02-14 NOTE — Progress Notes (Signed)
Mom awake and out of bed. In house interpreter (edda) used to talk to patient about the plan for the day including possible discharge. Mother had no questions for RN.

## 2017-02-14 NOTE — Discharge Summary (Signed)
OB Discharge Summary     Patient Name: Karina Ross DOB: [birth year] MRN: [id number]  Date of admission: 02/12/2017 Delivering MD: Nigel BridgemanWINBORNE, [place]   Date of discharge: 02/14/2017  Admitting diagnosis: 38.4 WEEKS PAIN Intrauterine pregnancy: 2925w4d     Secondary diagnosis:  Active Problems:   Normal labor   Vaginal delivery  Additional problems: none     Discharge diagnosis: Term Pregnancy Delivered                                                                                                Post partum procedures:none  Augmentation: AROM  Complications: None  Hospital course:  Onset of Labor With Vaginal Delivery     27 y.o. yo [id number] at 7725w4d was admitted in Active Labor on 02/12/2017. Patient had an uncomplicated labor course as follows:  Membrane Rupture Time/Date: 7:45 AM ,02/12/2017   Intrapartum Procedures: Episiotomy: None [1]                                         Lacerations:  1st degree [2]  Patient had a delivery of a Viable infant. 02/12/2017  Information for the patient's newborn:  Arrie AranContreras Ross, Girl Rubin Payordith [[id number]][[id number]]  Delivery Method: Vag-Spont    Pateint had an uncomplicated postpartum course.  She is ambulating, tolerating a regular diet, passing flatus, and urinating well. Patient is discharged home in stable condition on 02/14/17.   Physical exam  Vitals:   02/13/17 0416 02/13/17 0900 02/13/17 1831 02/14/17 0554  BP:  123/67 126/75 122/72  Pulse:  100 77 67  Resp:  20 18 18   Temp:  98 F (36.7 C) 98.5 F (36.9 C) 97.9 F (36.6 C)  TempSrc:   Oral Axillary  SpO2:   100% 100%  Weight: 88.3 kg (194 lb 9.6 oz)   89.2 kg (196 lb 9.6 oz)  Height:       General: alert, cooperative and no distress Lochia: appropriate Uterine Fundus: firm DVT Evaluation: No evidence of DVT seen on physical exam. No cords or calf tenderness. No significant calf/ankle edema. Labs: Lab Results  Component Value Date   WBC 11.5 (H) 02/12/2017    HGB 12.4 02/12/2017   HCT 35.5 (L) 02/12/2017   MCV 82.2 02/12/2017   PLT 279 02/12/2017   No flowsheet data found.  Discharge instruction: per After Visit Summary and "Baby and Me Booklet".  After visit meds:  Allergies as of 02/14/2017   No Known Allergies     Medication List    TAKE these medications   ibuprofen 600 MG tablet Commonly known as:  ADVIL,MOTRIN Take 1 tablet (600 mg total) by mouth every 6 (six) hours.   PNV PO Take by mouth.   senna-docusate 8.6-50 MG tablet Commonly known as:  Senokot-S Take 2 tablets by mouth daily. Start taking on:  02/15/2017       Diet: routine diet  Activity: Advance as tolerated. Pelvic rest for 6 weeks.   Outpatient follow up:4 weeks  Follow up Appt:No future appointments. Follow up Visit:No Follow-up on file. Follow-up Information    Department, [facility]. Schedule an appointment as soon as possible for a visit in 4 week(s).   Why:  Please follow up with your doctor in 4 weeks for post partum check up Contact information: [address] [place] [address] [phone]           Postpartum contraception: Nexplanon  Newborn Data: Live born female  Birth Weight: 6 lb 6.5 oz (2905 g) APGAR: 9, 9  Newborn Delivery   Birth date/time:  02/12/2017 12:31:00 Delivery type:  Vaginal, Spontaneous     Baby Feeding: Bottle and Breast Disposition:home with mother   02/14/2017 Oralia Manis, DO

## 2023-04-11 ENCOUNTER — Emergency Department (HOSPITAL_COMMUNITY): Payer: Self-pay

## 2023-04-11 ENCOUNTER — Other Ambulatory Visit: Payer: Self-pay

## 2023-04-11 ENCOUNTER — Encounter (HOSPITAL_COMMUNITY): Payer: Self-pay | Admitting: Emergency Medicine

## 2023-04-11 ENCOUNTER — Emergency Department (HOSPITAL_COMMUNITY)
Admission: EM | Admit: 2023-04-11 | Discharge: 2023-04-11 | Disposition: A | Discharge status: Home / Self Care | Payer: Self-pay | Attending: Emergency Medicine | Admitting: Emergency Medicine

## 2023-04-11 DIAGNOSIS — N23 Unspecified renal colic: Secondary | ICD-10-CM

## 2023-04-11 DIAGNOSIS — N132 Hydronephrosis with renal and ureteral calculous obstruction: Secondary | ICD-10-CM | POA: Insufficient documentation

## 2023-04-11 LAB — COMPREHENSIVE METABOLIC PANEL WITH GFR
ALT: 30 U/L (ref 0–44)
AST: 21 U/L (ref 15–41)
Albumin: 4.2 g/dL (ref 3.5–5.0)
Alkaline Phosphatase: 56 U/L (ref 38–126)
Anion gap: 12 (ref 5–15)
BUN: 15 mg/dL (ref 6–20)
CO2: 20 mmol/L — ABNORMAL LOW (ref 22–32)
Calcium: 9.2 mg/dL (ref 8.9–10.3)
Chloride: 105 mmol/L (ref 98–111)
Creatinine, Ser: 0.87 mg/dL (ref 0.44–1.00)
GFR, Estimated: >60 mL/min (ref >60)
Glucose, Bld: 133 mg/dL — ABNORMAL HIGH (ref 70–99)
Potassium: 3.9 mmol/L (ref 3.5–5.1)
Sodium: 137 mmol/L (ref 135–145)
Total Bilirubin: 1 mg/dL (ref 0.0–1.2)
Total Protein: 8.1 g/dL (ref 6.5–8.1)

## 2023-04-11 LAB — CBC
HCT: 38.1 % (ref 36.0–46.0)
Hemoglobin: 13 g/dL (ref 12.0–15.0)
MCH: 29.3 pg (ref 26.0–34.0)
MCHC: 34.1 g/dL (ref 30.0–36.0)
MCV: 86 fL (ref 80.0–100.0)
Platelets: 300 10*3/uL (ref 150–400)
RBC: 4.43 MIL/uL (ref 3.87–5.11)
RDW: 12.8 % (ref 11.5–15.5)
WBC: 12 10*3/uL — ABNORMAL HIGH (ref 4.0–10.5)
nRBC: 0 % (ref 0.0–0.2)

## 2023-04-11 LAB — URINALYSIS, ROUTINE W REFLEX MICROSCOPIC
Bacteria, UA: NONE SEEN
Bilirubin Urine: NEGATIVE
Glucose, UA: NEGATIVE mg/dL
Hgb urine dipstick: NEGATIVE
Ketones, ur: NEGATIVE mg/dL
Leukocytes,Ua: NEGATIVE
Nitrite: NEGATIVE
Protein, ur: 30 mg/dL — AB
Specific Gravity, Urine: 1.03 (ref 1.005–1.030)
pH: 5 (ref 5.0–8.0)

## 2023-04-11 LAB — HCG, SERUM, QUALITATIVE: Preg, Serum: NEGATIVE

## 2023-04-11 MED ORDER — ONDANSETRON 4 MG PO TBDP
4.0000 mg | ORAL_TABLET | Freq: Once | ORAL | Status: AC | PRN
  Administered 2023-04-11: 4 mg via ORAL
  Filled 2023-04-11: qty 1

## 2023-04-11 MED ORDER — KETOROLAC TROMETHAMINE 60 MG/2ML IM SOLN
30.0000 mg | Freq: Once | INTRAMUSCULAR | Status: AC
  Administered 2023-04-11: 30 mg via INTRAMUSCULAR
  Filled 2023-04-11: qty 2

## 2023-04-11 NOTE — ED Provider Notes (Signed)
 [place] EMERGENC[facility] DEPARTMENT AT [facility] Provider Note  CSN: [id number] Arrival date & time: 04/11/23 3295  Chief Complaint(s) Abdominal Pain  HPI Karina Ross is a 33 y.o. female     Abdominal Pain Pain location:  LLQ Pain quality: cramping   Pain radiates to:  L flank Pain severity:  Moderate Onset quality:  Sudden Duration:  10 hours Timing:  Constant Progression:  Waxing and waning Chronicity:  New Relieved by: being hunched over and pressing on LLQ. Worsened by:  Nothing Associated symptoms: nausea and vomiting   Associated symptoms: no chest pain, no constipation, no diarrhea, no dysuria, no fever, no flatus, no melena, no shortness of breath, no vaginal bleeding and no vaginal discharge     Past Medical History Past Medical History:  Diagnosis Date   Medical history non-contributory    Patient Active Problem List   Diagnosis Date Noted   Normal labor 02/12/2017   Vaginal delivery 02/12/2017   History of preterm delivery 09/02/2016   Supervision of high risk pregnancy, antepartum 09/02/2016   Home Medication(s) Prior to Admission medications   Medication Sig Start Date End Date Taking? Authorizing Provider  ibuprofen (ADVIL,MOTRIN) 600 MG tablet Take 1 tablet (600 mg total) by mouth every 6 (six) hours. 02/14/17   Oralia Manis, DO  Prenatal Vit w/Fe-Methylfol-FA (PNV PO) Take by mouth.    [provider]  senna-docusate (SENOKOT-S) 8.6-50 MG tablet Take 2 tablets by mouth daily. 02/15/17   Oralia Manis, DO                                                                                                                                    Allergies Patient has no known allergies.  Review of Systems Review of Systems  Constitutional:  Negative for fever.  Respiratory:  Negative for shortness of breath.   Cardiovascular:  Negative for chest pain.  Gastrointestinal:  Positive for abdominal pain, nausea and vomiting.  Negative for constipation, diarrhea, flatus and melena.  Genitourinary:  Negative for dysuria, vaginal bleeding and vaginal discharge.   As noted in HPI  Physical Exam Vital Signs  I have reviewed the triage vital signs BP (!) 139/92 (BP Location: Right Arm)   Pulse 90   Temp 98.4 F (36.9 C)   Resp 18   Ht 5\' 2"  (1.575 m)   Wt 88.9 kg   LMP 03/15/2023   SpO2 100%   Breastfeeding No   BMI 35.85 kg/m   Physical Exam Vitals reviewed.  Constitutional:      General: She is not in acute distress.    Appearance: She is well-developed. She is not diaphoretic.  HENT:     Head: Normocephalic and atraumatic.     Right Ear: External ear normal.     Left Ear: External ear normal.     Nose: Nose normal.  Eyes:     General: No scleral  icterus.    Conjunctiva/sclera: Conjunctivae normal.  Neck:     Trachea: Phonation normal.  Cardiovascular:     Rate and Rhythm: Normal rate and regular rhythm.  Pulmonary:     Effort: Pulmonary effort is normal. No respiratory distress.     Breath sounds: No stridor.  Abdominal:     General: There is no distension.     Tenderness: There is no abdominal tenderness. There is no guarding or rebound.     Comments: Pressing on LLQ improves pain  Musculoskeletal:        General: Normal range of motion.     Cervical back: Normal range of motion.  Neurological:     Mental Status: She is alert and oriented to person, place, and time.  Psychiatric:        Behavior: Behavior normal.     ED Results and Treatments Labs (all labs ordered are listed, but only abnormal results are displayed) Labs Reviewed  COMPREHENSIVE METABOLIC PANEL WITH GFR - Abnormal; Notable for the following components:      Result Value   CO2 20 (*)    Glucose, Bld 133 (*)    All other components within normal limits  CBC - Abnormal; Notable for the following components:   WBC 12.0 (*)    All other components within normal limits  URINALYSIS, ROUTINE W REFLEX MICROSCOPIC -  Abnormal; Notable for the following components:   APPearance HAZY (*)    Protein, ur 30 (*)    All other components within normal limits  HCG, SERUM, QUALITATIVE                                                                                                                         EKG  EKG Interpretation Date/Time:    Ventricular Rate:    PR Interval:    QRS Duration:    QT Interval:    QTC Calculation:   R Axis:      Text Interpretation:         Radiology CT Renal Stone Study Result Date: 04/11/2023 CLINICAL DATA:  Abdominal pain/flank pain. Evaluate for kidney stone. EXAM: CT ABDOMEN AND PELVIS WITHOUT CONTRAST TECHNIQUE: Multidetector CT imaging of the abdomen and pelvis was performed following the standard protocol without IV contrast. RADIATION DOSE REDUCTION: This exam was performed according to the departmental dose-optimization program which includes automated exposure control, adjustment of the mA and/or kV according to patient size and/or use of iterative reconstruction technique. COMPARISON:  None FINDINGS: Lower chest: Anterior right midlung nodule measures 3 mm, image 3/5. No pleural effusion or consolidative change. Hepatobiliary: Hepatic steatosis. No focal liver abnormality. Gallbladder appears normal. Pancreas: Unremarkable. No pancreatic ductal dilatation or surrounding inflammatory changes. Spleen: Normal in size without focal abnormality. Adrenals/Urinary Tract: Normal adrenal glands. The right kidney appears normal. Punctate stone noted within the inferior pole collecting system of the left kidney. Left-sided nephromegaly and perinephric fat stranding with mild hydronephrosis and hydroureter. Stone within the posterior  bladder near the left UVJ measures 3 mm, image 87/2. 3 mm calcification noted along the anterior wall of the bladder. Stomach/Bowel: Stomach appears normal. The appendix is visualized and is within normal limits. No pathologic dilatation of the large or  small bowel loops. Vascular/Lymphatic: No significant vascular findings are present. No enlarged abdominal or pelvic lymph nodes. Reproductive: Uterus appears normal. There are several prominent follicles within the left ovary which measure up to 1.3 cm. Other: No significant free fluid or fluid collections. Visualized osseous structures are unremarkable. Musculoskeletal: No acute or significant osseous findings. IMPRESSION: 1. Left-sided nephromegaly and perinephric fat stranding with mild hydronephrosis and hydroureter. Stone within the posterior bladder near the left UVJ measures 3 mm. 2. Punctate stone noted within the inferior pole collecting system of the left kidney. 3. Hepatic steatosis. 4. 3 mm right solid pulmonary nodule. If the patient is at high risk for bronchogenic carcinoma, follow-up chest CT at 1year is recommended. If the patient is at low risk, no follow-up is needed. This recommendation follows the consensus statement: Guidelines for Management of Small Pulmonary Nodules Detected on CT Scans: A Statement from the Fleischner Society as published in Radiology 2005; 237:395-400. Electronically Signed   By: Signa Kell M.D.   On: 04/11/2023 06:24    Medications Ordered in ED Medications  ondansetron (ZOFRAN-ODT) disintegrating tablet 4 mg (4 mg Oral Given 04/11/23 0455)  ketorolac (TORADOL) injection 30 mg (30 mg Intramuscular Given 04/11/23 0535)   Procedures Procedures  (including critical care time) Medical Decision Making / ED Course   Medical Decision Making Amount and/or Complexity of Data Reviewed Labs: ordered. Decision-making details documented in ED Course. Radiology: ordered and independent interpretation performed. Decision-making details documented in ED Course.  Risk Prescription drug management.    LLQ/Flank pain DDX considered. W/u below.  Work up not suggestive of UTI/pyelonephritis, or pregnancy related process. Doubt Torsion based on sx and exam. CT  consistent with renal stone found in bladder  After IM toradol, pain resolved    Final Clinical Impression(s) / ED Diagnoses Final diagnoses:  Renal colic on left side   The patient appears reasonably screened and/or stabilized for discharge and I doubt any other medical condition or other [facility] requiring further screening, evaluation, or treatment in the ED at this time. I have discussed the findings, Dx and Tx plan with the patient/family who expressed understanding and agree(s) with the plan. Discharge instructions discussed at length. The patient/family was given strict return precautions who verbalized understanding of the instructions. No further questions at time of discharge.  Disposition: Discharge  Condition: Good  ED Discharge Orders     None        Follow Up: Primary care provider  Call  to schedule an appointment for close follow up  Jerilee Field, MD [address] [place] [address] [phone]  Call  as needed    This chart was dictated using voice recognition software.  Despite best efforts to proofread,  errors can occur which can change the documentation meaning.    Nira Conn, MD 04/11/23 [phone]

## 2023-04-11 NOTE — ED Triage Notes (Signed)
 Pt reports sudden left lower abdominal pain that started yesterday around 6 pm. Pain associated with NV, emesis x2. Pain improves when pt is hunched over. Denies urinary s/s. No vaginal discharge/bleeding.   Pt requires spanish speaking interpreter.

## 2023-04-11 NOTE — Discharge Instructions (Signed)
 For pain control you may take 1000 mg of acetaminophen (Tylenol) every 8 hours and/or 600 mg of Ibuprofen (Motrin, Advil, etc.) every 6-8 hours as needed.  Please limit acetaminophen (Tylenol) to 4000 mg and Ibuprofen (Motrin, Advil, etc.) to 2400 mg for a 24hr period. Please note that other over-the-counter medicine may contain acetaminophen or ibuprofen as a component of their ingredients.   ---------------------------------------------------------------------------------------------------  Para controlar el dolor, puede tomar 1000 mg de acetaminofn (Tylenol) cada 8 horas o 600 mg de ibuprofeno (Motrin, Advil, etc.) cada 6-8 horas, segn sea necesario.  Limite la dosis de acetaminofn (Tylenol) a 4000 mg y la de ibuprofeno (Motrin, Advil, etc.) a 2400 mg durante 24 horas. Tenga en cuenta que otros medicamentos de venta libre pueden contener acetaminofn o ibuprofeno.
